# Patient Record
Sex: Male | Born: 1990 | Race: White | Hispanic: No | Marital: Single | Smoking: Current every day smoker
Health system: Southern US, Community
[De-identification: ages and names within clinical notes are randomized; demographics above are authoritative.]

## PROBLEM LIST (undated history)

## (undated) DIAGNOSIS — F32A Depression, unspecified: Secondary | ICD-10-CM

## (undated) DIAGNOSIS — F419 Anxiety disorder, unspecified: Secondary | ICD-10-CM

## (undated) HISTORY — PX: MOUTH SURGERY: SHX715

## (undated) HISTORY — PX: HAND SURGERY: SHX662

---
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
## 2019-08-14 ENCOUNTER — Other Ambulatory Visit: Payer: Self-pay

## 2019-08-14 ENCOUNTER — Encounter (HOSPITAL_COMMUNITY): Payer: Self-pay | Admitting: Emergency Medicine

## 2019-08-14 ENCOUNTER — Emergency Department (HOSPITAL_COMMUNITY)
Admission: EM | Admit: 2019-08-14 | Discharge: 2019-08-14 | Disposition: A | Discharge status: Home / Self Care | Payer: Self-pay | Attending: Emergency Medicine | Admitting: Emergency Medicine

## 2019-08-14 DIAGNOSIS — R21 Rash and other nonspecific skin eruption: Secondary | ICD-10-CM | POA: Insufficient documentation

## 2019-08-14 MED ORDER — CETIRIZINE HCL 10 MG PO TABS: 10.0000 mg | ORAL_TABLET | Freq: Every day | ORAL | 1 refills | Status: DC

## 2019-08-14 NOTE — Discharge Instructions (Signed)
Please take the cetirizine once daily to help with itching. Avoid scratching as much as possible to prevent a secondary bacterial infection. Follow-up with primary care if symptoms persist. Return to the emergency department for swelling of the lips or tongue, difficulty breathing, new or concerning symptoms.

## 2019-08-14 NOTE — ED Triage Notes (Signed)
Pt reports breaking out on and off for the past few days mostly on his torso. Unsure if he is getting bitten by something or encountering something that is breaking him out. Denies sob.

## 2019-08-14 NOTE — ED Notes (Signed)
Patient verbalizes understanding of discharge instructions . Opportunity for questions and answers were provided . Armband removed by staff ,Pt discharged from ED. W/C  offered at D/C  and Declined W/C at D/C and was escorted to lobby by RN.  

## 2019-08-14 NOTE — ED Provider Notes (Signed)
[facility] EMERGENCY DEPARTMENT Provider Note   CSN: [id number] Arrival date & time: 08/14/19  1001     History Chief Complaint  Patient presents with  . Rash    Joseph Franco is a 29 y.o. male without significant PMHx, presenting to the ED with complaint of intermittent rash to his torso over the last few days.  He states recently he got out of jail and is staying at a halfway house.  He states he is not washing his linens though he is washing his clothes and has not used any new detergents.  No new foods or medications.  No known insect bites.  His rash comes and goes it is mostly to his torso.  It is made worse when he sweats.  It is itchy.  No involvement of his mouth.  No swelling of his lips or tongue, no difficulty breathing or swallowing.  No shortness of breath or other systemic symptoms.  He has not treated his symptoms with any medications.  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  The history is provided by the patient.       History reviewed. No pertinent past medical history.  There are no problems to display for this patient.   History reviewed. No pertinent surgical history.     No family history on file.  Social History   Tobacco Use  . Smoking status: Not on file  Substance Use Topics  . Alcohol use: Not on file  . Drug use: Not on file    Home Medications Prior to Admission medications   Medication Sig Start Date End Date Taking? Authorizing Provider  cetirizine (ZYRTEC) 10 MG tablet Take 1 tablet (10 mg total) by mouth daily. 08/14/19   Augusta Hilbert, [place] N, PA-C    Allergies    Patient has no known allergies.  Review of Systems   Review of Systems  Constitutional: Negative for fever.  Skin: Positive for rash.    Physical Exam Updated Vital Signs BP 130/76   Pulse (!) 108   Temp 98.3 F (36.8 C)   Resp 12   SpO2 98%   Physical Exam Vitals and nursing note reviewed.  Constitutional:      Appearance: He is  well-developed.  HENT:     Head: Normocephalic and atraumatic.  Eyes:     Conjunctiva/sclera: Conjunctivae normal.  Cardiovascular:     Rate and Rhythm: Normal rate.  Pulmonary:     Effort: Pulmonary effort is normal. No respiratory distress.  Skin:    Comments: Excoriation throughout patient's torso though no obvious underlying rash at this time.  No drainage, erythema, induration.  No involvement of the palms.  Neurological:     Mental Status: He is alert.  Psychiatric:        Mood and Affect: Mood normal.        Behavior: Behavior normal.     ED Results / Procedures / Treatments   Labs (all labs ordered are listed, but only abnormal results are displayed) Labs Reviewed - No data to display  EKG None  Radiology No results found.  Procedures Procedures (including critical care time)  Medications Ordered in ED Medications - No data to display  ED Course  I have reviewed the triage vital signs and the nursing notes.  Pertinent labs & imaging results that were available during my care of the patient were reviewed by me and considered in my medical decision making (see chart for details).  MDM Rules/Calculators/A&P                         Patient presenting with intermittent itchy rash to his torso, seems consistent with urticaria.  He is not currently able to launder his own linens as he is staying in a halfway house.  No other new personal products foods, insect bites, medications.  No rashes present on evaluation today though there is evidence of excoriation on his chest.  He has not been treating his symptoms with anything.  No symptoms of anaphylaxis.  Recommend antihistamines and outpatient follow-up if symptoms persist.  Return precautions discussed.  Discussed results, findings, treatment and follow up. Patient advised of return precautions. Patient verbalized understanding and agreed with plan.  Final Clinical Impression(s) / ED Diagnoses Final diagnoses:    Rash    Rx / DC Orders ED Discharge Orders         Ordered    cetirizine (ZYRTEC) 10 MG tablet  Daily     Discontinue  Reprint     08/14/19 1256           Haille Pardi, [place] N, PA-C 08/14/19 1257    Benjiman Core, MD 08/14/19 1625

## 2019-09-07 ENCOUNTER — Emergency Department (HOSPITAL_COMMUNITY)
Admission: EM | Admit: 2019-09-07 | Discharge: 2019-09-07 | Disposition: A | Discharge status: Home / Self Care | Payer: Self-pay | Attending: Emergency Medicine | Admitting: Emergency Medicine

## 2019-09-07 ENCOUNTER — Encounter (HOSPITAL_COMMUNITY): Payer: Self-pay

## 2019-09-07 DIAGNOSIS — F410 Panic disorder [episodic paroxysmal anxiety] without agoraphobia: Secondary | ICD-10-CM | POA: Insufficient documentation

## 2019-09-07 DIAGNOSIS — Z5321 Procedure and treatment not carried out due to patient leaving prior to being seen by health care provider: Secondary | ICD-10-CM | POA: Insufficient documentation

## 2019-09-07 LAB — RAPID URINE DRUG SCREEN, HOSP PERFORMED
Amphetamines: NOT DETECTED
Barbiturates: NOT DETECTED
Benzodiazepines: NOT DETECTED
Cocaine: NOT DETECTED
Opiates: NOT DETECTED
Tetrahydrocannabinol: NOT DETECTED

## 2019-09-07 NOTE — ED Notes (Signed)
Pt called multiple times by staff and pt visitor. Pt visitor stated that he came in by ambulance and is not seen in the department lobby. Visitor stated that he uses EMS as a ride into town occasionally, so he must have left before visitor got here.

## 2019-09-07 NOTE — ED Triage Notes (Signed)
Pt arrives to ED via [facility] w/ c/o anxiety. Pt denies chest pain and sob. Pt states, "I felt like I was having a panic attack." EMS VSS.

## 2019-12-27 ENCOUNTER — Emergency Department (HOSPITAL_COMMUNITY)
Admission: EM | Admit: 2019-12-27 | Discharge: 2019-12-27 | Disposition: A | Discharge status: Home / Self Care | Payer: Self-pay | Attending: Emergency Medicine | Admitting: Emergency Medicine

## 2019-12-27 ENCOUNTER — Other Ambulatory Visit: Payer: Self-pay

## 2019-12-27 ENCOUNTER — Encounter (HOSPITAL_COMMUNITY): Payer: Self-pay | Admitting: *Deleted

## 2019-12-27 DIAGNOSIS — K122 Cellulitis and abscess of mouth: Secondary | ICD-10-CM | POA: Insufficient documentation

## 2019-12-27 DIAGNOSIS — F17210 Nicotine dependence, cigarettes, uncomplicated: Secondary | ICD-10-CM | POA: Insufficient documentation

## 2019-12-27 LAB — GROUP A STREP BY PCR: Group A Strep by PCR: NOT DETECTED

## 2019-12-27 MED ORDER — DIPHENHYDRAMINE HCL 25 MG PO CAPS
25.0000 mg | ORAL_CAPSULE | Freq: Once | ORAL | Status: AC
  Administered 2019-12-27: 25 mg via ORAL
  Filled 2019-12-27: qty 1

## 2019-12-27 MED ORDER — DIPHENHYDRAMINE HCL 25 MG PO TABS: 25.0000 mg | ORAL_TABLET | Freq: Three times a day (TID) | ORAL | 0 refills | Status: DC

## 2019-12-27 MED ORDER — DEXAMETHASONE SODIUM PHOSPHATE 10 MG/ML IJ SOLN
10.0000 mg | Freq: Once | INTRAMUSCULAR | Status: AC
  Administered 2019-12-27: 10 mg via INTRAMUSCULAR
  Filled 2019-12-27: qty 1

## 2019-12-27 MED ORDER — CEPHALEXIN 500 MG PO CAPS: 500.0000 mg | ORAL_CAPSULE | Freq: Two times a day (BID) | ORAL | 0 refills | Status: AC

## 2019-12-27 MED ORDER — FAMOTIDINE 20 MG PO TABS
20.0000 mg | ORAL_TABLET | Freq: Once | ORAL | Status: AC
  Administered 2019-12-27: 20 mg via ORAL
  Filled 2019-12-27: qty 1

## 2019-12-27 MED ORDER — CETIRIZINE HCL 10 MG PO TABS: 10.0000 mg | ORAL_TABLET | Freq: Every day | ORAL | 0 refills | Status: DC

## 2019-12-27 NOTE — ED Triage Notes (Signed)
Pt c/o swollen uvula x 2 hours

## 2019-12-27 NOTE — Discharge Instructions (Signed)
As discussed, today's evaluation was generally reassuring. Your uvulitis is likely due to either allergic or infectious causes.  Please take all medication as prescribed.  You may use over-the-counter substitutions for your prescribed Zyrtec and Benadryl, but please follow instructions for dosing.  Return here for concerning changes in your condition.

## 2019-12-27 NOTE — ED Notes (Signed)
Report given to oncoming nurse.

## 2019-12-27 NOTE — ED Provider Notes (Signed)
[facility] EMERGENCY DEPARTMENT Provider Note   CSN: [id number] Arrival date & time: 12/27/19  2952     History Chief Complaint  Patient presents with  . Sore Throat    Joseph Franco is a 29 y.o. male.  HPI Patient presents with a male companion who assists with the HPI. Patient was well prior to the onset of illness which was about 2 hours ago.  He denies medical problems, does have seasonal allergies, but no known food allergies.  He recalls eating any seafood restaurant yesterday.  Otherwise no recent medication change, diet change, activity change.  Today the patient awoke with full sensation in the posterior of his throat.  After looking the mirror he noticed his uvula was enlarged.  No chest pain, no dyspnea, no difficulty speaking, no fever, no vomiting.  No medication taken for relief.  History reviewed. No pertinent past medical history.  There are no problems to display for this patient.   Past Surgical History:  Procedure Laterality Date  . HAND SURGERY Right   . MOUTH SURGERY         History reviewed. No pertinent family history.  Social History   Tobacco Use  . Smoking status: Current Every Day Smoker    Packs/day: 1.00    Types: Cigarettes  . Smokeless tobacco: Never Used  Substance Use Topics  . Alcohol use: Yes    Comment: 6-8 beers daily  . Drug use: Not Currently    Home Medications Prior to Admission medications   Medication Sig Start Date End Date Taking? Authorizing Provider  cephALEXin (KEFLEX) 500 MG capsule Take 1 capsule (500 mg total) by mouth 2 (two) times daily for 5 days. 12/27/19 01/01/20  Gerhard Munch, MD  cetirizine (ZYRTEC) 10 MG tablet Take 1 tablet (10 mg total) by mouth daily for 5 days. 12/27/19 01/01/20  Gerhard Munch, MD  diphenhydrAMINE (BENADRYL) 25 MG tablet Take 1 tablet (25 mg total) by mouth 3 (three) times daily. Take one tablet three times daily for two days 12/27/19   Gerhard Munch, MD     Allergies    Patient has no known allergies.  Review of Systems   Review of Systems  Constitutional:       Per HPI, otherwise negative  HENT:       Per HPI, otherwise negative  Respiratory:       Per HPI, otherwise negative  Cardiovascular:       Per HPI, otherwise negative  Gastrointestinal: Negative for vomiting.  Endocrine:       Negative aside from HPI  Genitourinary:       Neg aside from HPI   Musculoskeletal:       Per HPI, otherwise negative  Skin: Negative.   Neurological: Negative for syncope.    Physical Exam Updated Vital Signs BP 131/73   Pulse 78   Temp 98.7 F (37.1 C) (Oral)   Resp 16   Ht 5\' 11"  (1.803 m)   Wt 81.6 kg   SpO2 96%   BMI 25.10 kg/m   Physical Exam Vitals and nursing note reviewed.  Constitutional:      General: He is not in acute distress.    Appearance: He is well-developed.  HENT:     Head: Normocephalic and atraumatic.     Mouth/Throat:   Eyes:     Conjunctiva/sclera: Conjunctivae normal.  Cardiovascular:     Rate and Rhythm: Normal rate and regular rhythm.  Pulmonary:     Effort: Pulmonary  effort is normal. No respiratory distress.     Breath sounds: No stridor.  Abdominal:     General: There is no distension.  Skin:    General: Skin is warm and dry.  Neurological:     Mental Status: He is alert and oriented to person, place, and time.     ED Results / Procedures / Treatments   Labs (all labs ordered are listed, but only abnormal results are displayed) Labs Reviewed  GROUP A STREP BY PCR    Procedures Procedures (including critical care time)  Medications Ordered in ED Medications  dexamethasone (DECADRON) injection 10 mg (10 mg Intramuscular Given 12/27/19 0748)  famotidine (PEPCID) tablet 20 mg (20 mg Oral Given 12/27/19 0748)  diphenhydrAMINE (BENADRYL) capsule 25 mg (25 mg Oral Given 12/27/19 4098)    ED Course  I have reviewed the triage vital signs and the nursing notes.  Pertinent labs &  imaging results that were available during my care of the patient were reviewed by me and considered in my medical decision making (see chart for details).    MDM Rules/Calculators/A&P                          9:01 AM On repeat exam the patient's uvula has diminished in size.  Strep test negative.  Discussed evaluation with him and his male companion, given his improvement, absence of airway compromise, evidence for bacteremia, sepsis, patient is appropriate for discharge with ongoing antibiotics, antihistamines. Final Clinical Impression(s) / ED Diagnoses Final diagnoses:  Uvulitis    Rx / DC Orders ED Discharge Orders         Ordered    cephALEXin (KEFLEX) 500 MG capsule  2 times daily        12/27/19 0900    cetirizine (ZYRTEC) 10 MG tablet  Daily        12/27/19 0900    diphenhydrAMINE (BENADRYL) 25 MG tablet  3 times daily        12/27/19 0900           Gerhard Munch, MD 12/27/19 [phone]

## 2023-05-06 ENCOUNTER — Other Ambulatory Visit: Payer: Self-pay

## 2023-05-06 ENCOUNTER — Emergency Department (HOSPITAL_COMMUNITY)
Admission: EM | Admit: 2023-05-06 | Discharge: 2023-05-07 | Disposition: A | Discharge status: Home / Self Care | Attending: Emergency Medicine | Admitting: Emergency Medicine

## 2023-05-06 ENCOUNTER — Encounter (HOSPITAL_COMMUNITY): Payer: Self-pay

## 2023-05-06 ENCOUNTER — Emergency Department (HOSPITAL_COMMUNITY)

## 2023-05-06 DIAGNOSIS — S6710XA Crushing injury of unspecified finger(s), initial encounter: Secondary | ICD-10-CM | POA: Diagnosis not present

## 2023-05-06 DIAGNOSIS — S62664B Nondisplaced fracture of distal phalanx of right ring finger, initial encounter for open fracture: Secondary | ICD-10-CM | POA: Diagnosis not present

## 2023-05-06 DIAGNOSIS — S6991XA Unspecified injury of right wrist, hand and finger(s), initial encounter: Secondary | ICD-10-CM | POA: Diagnosis present

## 2023-05-06 DIAGNOSIS — S62639B Displaced fracture of distal phalanx of unspecified finger, initial encounter for open fracture: Secondary | ICD-10-CM

## 2023-05-06 DIAGNOSIS — W230XXA Caught, crushed, jammed, or pinched between moving objects, initial encounter: Secondary | ICD-10-CM | POA: Diagnosis not present

## 2023-05-06 DIAGNOSIS — F17210 Nicotine dependence, cigarettes, uncomplicated: Secondary | ICD-10-CM | POA: Diagnosis not present

## 2023-05-06 MED ORDER — HYDROMORPHONE HCL 1 MG/ML IJ SOLN
1.0000 mg | Freq: Once | INTRAMUSCULAR | Status: AC
  Administered 2023-05-06: 1 mg via INTRAVENOUS

## 2023-05-06 MED ORDER — HYDROMORPHONE HCL 1 MG/ML IJ SOLN
1.0000 mg | Freq: Once | INTRAMUSCULAR | Status: DC
  Filled 2023-05-06: qty 1

## 2023-05-06 MED ORDER — LIDOCAINE HCL (PF) 1 % IJ SOLN
30.0000 mL | Freq: Once | INTRAMUSCULAR | Status: AC
  Administered 2023-05-06: 30 mL
  Filled 2023-05-06: qty 30

## 2023-05-06 MED ORDER — CEFAZOLIN SODIUM-DEXTROSE 2-4 GM/100ML-% IV SOLN
2.0000 g | Freq: Once | INTRAVENOUS | Status: AC
  Administered 2023-05-07: 2 g via INTRAVENOUS
  Filled 2023-05-06: qty 100
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
## 2023-05-06 NOTE — ED Notes (Signed)
 Warm soapy soak and wound cleaning completed per provider. Patient unable to tolerate soak for long due to pain.
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
## 2023-05-07 MED ORDER — HYDROMORPHONE HCL 1 MG/ML IJ SOLN
2.0000 mg | Freq: Once | INTRAMUSCULAR | Status: AC
  Administered 2023-05-07: 2 mg via INTRAVENOUS
  Filled 2023-05-07: qty 2

## 2023-05-07 MED ORDER — CEPHALEXIN 500 MG PO CAPS: 500.0000 mg | ORAL_CAPSULE | Freq: Three times a day (TID) | ORAL | 0 refills | Status: AC

## 2023-05-07 MED ORDER — OXYCODONE-ACETAMINOPHEN 5-325 MG PO TABS: 1.0000 | ORAL_TABLET | Freq: Four times a day (QID) | ORAL | 0 refills | Status: AC | PRN

## 2023-05-07 NOTE — Discharge Instructions (Addendum)
 You were evaluated in the Emergency Department and after careful evaluation, we did not find any emergent condition requiring admission or further testing in the hospital.  Your exam/testing today was overall reassuring.  You have a partial amputation of your finger.  This will require close follow-up with the hand specialist for definitive repair.  Call the office of Dr. Romeo Apple in the morning to set up an official appointment time.  He is aware of your case.  If for some reason you are unable to get follow-up, would recommend return to the emergency department within the next 2 or 3 days for reassessment.  Use the Keflex antibiotic to prevent infection, take the Percocet medication as needed for pain.  Please return to the Emergency Department if you experience any worsening of your condition.  Thank you for allowing [place] to be a part of your care.

## 2023-05-10 MED FILL — Oxycodone w/ Acetaminophen Tab 5-325 MG: ORAL | Qty: 6 | Status: AC

## 2023-07-21 ENCOUNTER — Ambulatory Visit: Admitting: Orthopaedic Surgery

## 2023-07-21 ENCOUNTER — Encounter: Payer: Self-pay | Admitting: Orthopaedic Surgery

## 2023-07-21 VITALS — BP 140/88 | HR 80 | Ht 71.0 in | Wt 180.0 lb

## 2023-07-21 DIAGNOSIS — S6991XA Unspecified injury of right wrist, hand and finger(s), initial encounter: Secondary | ICD-10-CM
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
## 2023-07-21 NOTE — Patient Instructions (Signed)
 To see hand surgery [place]

## 2023-08-09 ENCOUNTER — Other Ambulatory Visit: Payer: Self-pay

## 2023-08-09 ENCOUNTER — Ambulatory Visit (INDEPENDENT_AMBULATORY_CARE_PROVIDER_SITE_OTHER): Admitting: Orthopedic Surgery

## 2023-08-09 DIAGNOSIS — S6991XA Unspecified injury of right wrist, hand and finger(s), initial encounter: Secondary | ICD-10-CM

## 2023-08-09 DIAGNOSIS — S6710XA Crushing injury of unspecified finger(s), initial encounter: Secondary | ICD-10-CM

## 2023-08-09 HISTORY — DX: Crushing injury of unspecified finger(s), initial encounter: S67.10XA
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
## 2023-08-12 ENCOUNTER — Telehealth: Payer: Self-pay

## 2023-08-12 NOTE — Telephone Encounter (Signed)
 FYI I spoke with [place], nurse for the prison and she stated since pt will need OT after surgery and multiple visits they will potentially have to [facility] the [facility]. She will call us  back and let us  know soon

## 2023-08-24 NOTE — Telephone Encounter (Signed)
 Last conversation in regards to not having a surgery sheet was created in error. Incorrect patient, please disregard.

## 2023-08-24 NOTE — Telephone Encounter (Signed)
 I spoke with the nurse and scheduled the patient for surgery on 7/25.

## 2023-09-06 ENCOUNTER — Encounter (HOSPITAL_BASED_OUTPATIENT_CLINIC_OR_DEPARTMENT_OTHER): Payer: Self-pay | Admitting: Orthopedic Surgery

## 2023-09-06 ENCOUNTER — Other Ambulatory Visit: Payer: Self-pay

## 2023-09-07 ENCOUNTER — Other Ambulatory Visit: Payer: Self-pay

## 2023-09-07 DIAGNOSIS — S6991XA Unspecified injury of right wrist, hand and finger(s), initial encounter: Secondary | ICD-10-CM

## 2023-09-09 NOTE — Anesthesia Preprocedure Evaluation (Signed)
 Anesthesia Evaluation  Patient identified by MRN, date of birth, ID band Patient awake    Reviewed: Allergy & Precautions, NPO status , Patient's Chart, lab work & pertinent test results  History of Anesthesia Complications Negative for: history of anesthetic complications  Airway Mallampati: I  TM Distance: >3 FB Neck ROM: Full    Dental  (+) Missing, Poor Dentition, Dental Advisory Given   Pulmonary Current Smoker and Patient abstained from smoking.   breath sounds clear to auscultation       Cardiovascular negative cardio ROS  Rhythm:Regular Rate:Normal     Neuro/Psych   Anxiety Depression    negative neurological ROS     GI/Hepatic negative GI ROS, Neg liver ROS,,,  Endo/Other  BMI 32  Renal/GU negative Renal ROS     Musculoskeletal   Abdominal   Peds  Hematology negative hematology ROS (+)   Anesthesia Other Findings   Reproductive/Obstetrics                              Anesthesia Physical Anesthesia Plan  ASA: 2  Anesthesia Plan: General   Post-op Pain Management: Tylenol  PO (pre-op)*   Induction: Intravenous  PONV Risk Score and Plan: Dexamethasone  and Ondansetron  Airway Management Planned: LMA  Additional Equipment: None  Intra-op Plan:   Post-operative Plan:   Informed Consent: I have reviewed the patients History and Physical, chart, labs and discussed the procedure including the risks, benefits and alternatives for the proposed anesthesia with the patient or authorized representative who has indicated his/her understanding and acceptance.     Dental advisory given  Plan Discussed with: CRNA and Surgeon  Anesthesia Plan Comments:          Anesthesia Quick Evaluation

## 2023-09-10 ENCOUNTER — Other Ambulatory Visit: Payer: Self-pay

## 2023-09-10 ENCOUNTER — Ambulatory Visit (HOSPITAL_BASED_OUTPATIENT_CLINIC_OR_DEPARTMENT_OTHER): Payer: Self-pay | Admitting: Anesthesiology

## 2023-09-10 ENCOUNTER — Ambulatory Visit (HOSPITAL_BASED_OUTPATIENT_CLINIC_OR_DEPARTMENT_OTHER)
Admission: RE | Admit: 2023-09-10 | Discharge: 2023-09-10 | Disposition: A | Discharge status: Home / Self Care | Attending: Orthopedic Surgery | Admitting: Orthopedic Surgery

## 2023-09-10 ENCOUNTER — Ambulatory Visit (HOSPITAL_BASED_OUTPATIENT_CLINIC_OR_DEPARTMENT_OTHER)

## 2023-09-10 ENCOUNTER — Encounter (HOSPITAL_BASED_OUTPATIENT_CLINIC_OR_DEPARTMENT_OTHER)
Admission: RE | Disposition: A | Discharge status: Home / Self Care | Payer: Self-pay | Source: Home / Self Care | Attending: Orthopedic Surgery

## 2023-09-10 ENCOUNTER — Encounter (HOSPITAL_BASED_OUTPATIENT_CLINIC_OR_DEPARTMENT_OTHER): Payer: Self-pay | Admitting: Orthopedic Surgery

## 2023-09-10 DIAGNOSIS — M20021 Boutonniere deformity of right finger(s): Secondary | ICD-10-CM | POA: Diagnosis not present

## 2023-09-10 DIAGNOSIS — X58XXXA Exposure to other specified factors, initial encounter: Secondary | ICD-10-CM | POA: Diagnosis not present

## 2023-09-10 DIAGNOSIS — M20091 Other deformity of right finger(s): Secondary | ICD-10-CM

## 2023-09-10 DIAGNOSIS — S6991XA Unspecified injury of right wrist, hand and finger(s), initial encounter: Secondary | ICD-10-CM | POA: Diagnosis not present

## 2023-09-10 DIAGNOSIS — M25341 Other instability, right hand: Secondary | ICD-10-CM | POA: Diagnosis present

## 2023-09-10 HISTORY — DX: Anxiety disorder, unspecified: F41.9

## 2023-09-10 HISTORY — PX: CAPSULOTOMY: SHX379

## 2023-09-10 HISTORY — DX: Depression, unspecified: F32.A

## 2023-09-10 SURGERY — CAPSULOTOMY, WRIST
Anesthesia: General | Site: Index Finger | Laterality: Right

## 2023-09-10 MED ORDER — FENTANYL CITRATE (PF) 100 MCG/2ML IJ SOLN
INTRAMUSCULAR | Status: AC
  Filled 2023-09-10: qty 2

## 2023-09-10 MED ORDER — DEXAMETHASONE SODIUM PHOSPHATE 10 MG/ML IJ SOLN
INTRAMUSCULAR | Status: DC | PRN
  Administered 2023-09-10: 5 mg via INTRAVENOUS

## 2023-09-10 MED ORDER — OXYCODONE HCL 5 MG PO TABS
ORAL_TABLET | ORAL | Status: AC
  Filled 2023-09-10: qty 1

## 2023-09-10 MED ORDER — MEPERIDINE HCL 25 MG/ML IJ SOLN: 6.2500 mg | INTRAMUSCULAR | Status: DC | PRN

## 2023-09-10 MED ORDER — EPHEDRINE 5 MG/ML INJ
INTRAVENOUS | Status: AC
  Filled 2023-09-10: qty 5

## 2023-09-10 MED ORDER — CEFAZOLIN SODIUM-DEXTROSE 2-4 GM/100ML-% IV SOLN: 2.0000 g | INTRAVENOUS | Status: DC

## 2023-09-10 MED ORDER — EPHEDRINE SULFATE (PRESSORS) 50 MG/ML IJ SOLN
INTRAMUSCULAR | Status: DC | PRN
  Administered 2023-09-10: 10 mg via INTRAVENOUS
  Administered 2023-09-10: 5 mg via INTRAVENOUS
  Administered 2023-09-10: 10 mg via INTRAVENOUS

## 2023-09-10 MED ORDER — EPHEDRINE 5 MG/ML INJ
INTRAVENOUS | Status: AC
  Filled 2023-09-10: qty 15

## 2023-09-10 MED ORDER — LIDOCAINE 2% (20 MG/ML) 5 ML SYRINGE
INTRAMUSCULAR | Status: AC
  Filled 2023-09-10: qty 5

## 2023-09-10 MED ORDER — DEXAMETHASONE SODIUM PHOSPHATE 10 MG/ML IJ SOLN
INTRAMUSCULAR | Status: AC
Start: 2023-09-10 — End: 2023-09-10
  Filled 2023-09-10: qty 1

## 2023-09-10 MED ORDER — PHENYLEPHRINE HCL (PRESSORS) 10 MG/ML IV SOLN
INTRAVENOUS | Status: DC | PRN
Start: 2023-09-10 — End: 2023-09-10
  Administered 2023-09-10: 160 ug via INTRAVENOUS
  Administered 2023-09-10: 80 ug via INTRAVENOUS

## 2023-09-10 MED ORDER — ACETAMINOPHEN 500 MG PO TABS
1000.0000 mg | ORAL_TABLET | Freq: Once | ORAL | Status: AC
  Administered 2023-09-10: 1000 mg via ORAL

## 2023-09-10 MED ORDER — OXYCODONE HCL 5 MG/5ML PO SOLN: 5.0000 mg | Freq: Once | ORAL | Status: AC | PRN

## 2023-09-10 MED ORDER — MIDAZOLAM HCL 2 MG/2ML IJ SOLN
INTRAMUSCULAR | Status: DC | PRN
  Administered 2023-09-10: 2 mg via INTRAVENOUS

## 2023-09-10 MED ORDER — MIDAZOLAM HCL 2 MG/2ML IJ SOLN: 0.5000 mg | Freq: Once | INTRAMUSCULAR | Status: DC | PRN

## 2023-09-10 MED ORDER — ACETAMINOPHEN 500 MG PO TABS
ORAL_TABLET | ORAL | Status: AC
Start: 2023-09-10 — End: 2023-09-10
  Filled 2023-09-10: qty 2

## 2023-09-10 MED ORDER — HYDROMORPHONE HCL 1 MG/ML IJ SOLN
INTRAMUSCULAR | Status: AC
  Filled 2023-09-10: qty 0.5

## 2023-09-10 MED ORDER — PROPOFOL 10 MG/ML IV BOLUS
INTRAVENOUS | Status: AC
  Filled 2023-09-10: qty 20

## 2023-09-10 MED ORDER — HYDROMORPHONE HCL 1 MG/ML IJ SOLN
0.2500 mg | INTRAMUSCULAR | Status: DC | PRN
  Administered 2023-09-10 (×2): 0.5 mg via INTRAVENOUS

## 2023-09-10 MED ORDER — CEFAZOLIN SODIUM-DEXTROSE 2-4 GM/100ML-% IV SOLN
INTRAVENOUS | Status: AC
  Filled 2023-09-10: qty 100

## 2023-09-10 MED ORDER — LIDOCAINE HCL (CARDIAC) PF 100 MG/5ML IV SOSY
PREFILLED_SYRINGE | INTRAVENOUS | Status: DC | PRN
  Administered 2023-09-10: 20 mg via INTRAVENOUS

## 2023-09-10 MED ORDER — ONDANSETRON HCL 4 MG/2ML IJ SOLN
INTRAMUSCULAR | Status: AC
  Filled 2023-09-10: qty 2

## 2023-09-10 MED ORDER — OXYCODONE HCL 5 MG PO TABS
5.0000 mg | ORAL_TABLET | Freq: Once | ORAL | Status: AC | PRN
  Administered 2023-09-10: 5 mg via ORAL

## 2023-09-10 MED ORDER — OXYCODONE HCL 5 MG PO TABS: 5.0000 mg | ORAL_TABLET | Freq: Four times a day (QID) | ORAL | 0 refills | Status: AC | PRN

## 2023-09-10 MED ORDER — ONDANSETRON HCL 4 MG/2ML IJ SOLN
INTRAMUSCULAR | Status: DC | PRN
  Administered 2023-09-10: 4 mg via INTRAVENOUS

## 2023-09-10 MED ORDER — LIDOCAINE HCL 1 % IJ SOLN
INTRAMUSCULAR | Status: DC | PRN
  Administered 2023-09-10: 10 mL via INTRADERMAL

## 2023-09-10 MED ORDER — LACTATED RINGERS IV SOLN: INTRAVENOUS | Status: DC | PRN

## 2023-09-10 MED ORDER — FENTANYL CITRATE (PF) 100 MCG/2ML IJ SOLN
INTRAMUSCULAR | Status: DC | PRN
  Administered 2023-09-10: 100 ug via INTRAVENOUS

## 2023-09-10 MED ORDER — PROPOFOL 10 MG/ML IV BOLUS
INTRAVENOUS | Status: DC | PRN
  Administered 2023-09-10: 300 mg via INTRAVENOUS

## 2023-09-10 MED ORDER — MIDAZOLAM HCL 2 MG/2ML IJ SOLN
INTRAMUSCULAR | Status: AC
  Filled 2023-09-10: qty 2

## 2023-09-10 MED ORDER — DEXMEDETOMIDINE HCL IN NACL 80 MCG/20ML IV SOLN
INTRAVENOUS | Status: DC | PRN
  Administered 2023-09-10: 12 ug via INTRAVENOUS
  Administered 2023-09-10: 8 ug via INTRAVENOUS

## 2023-09-10 MED ORDER — LACTATED RINGERS IV SOLN: INTRAVENOUS | Status: DC

## 2023-09-10 MED ORDER — CEFAZOLIN SODIUM-DEXTROSE 2-3 GM-%(50ML) IV SOLR
INTRAVENOUS | Status: DC | PRN
  Administered 2023-09-10: 2 g via INTRAVENOUS

## 2023-09-10 SURGICAL SUPPLY — 45 items
BLADE SURG 15 STRL LF DISP TIS (BLADE) ×2 IMPLANT
BNDG COHESIVE 4X5 TAN STRL LF (GAUZE/BANDAGES/DRESSINGS) ×1 IMPLANT
BNDG COMPR ESMARK 4X3 LF (GAUZE/BANDAGES/DRESSINGS) ×1 IMPLANT
BNDG ELASTIC 3INX 5YD STR LF (GAUZE/BANDAGES/DRESSINGS) IMPLANT
BNDG ELASTIC 4INX 5YD STR LF (GAUZE/BANDAGES/DRESSINGS) ×2 IMPLANT
BNDG GAUZE DERMACEA FLUFF 4 (GAUZE/BANDAGES/DRESSINGS) ×1 IMPLANT
CAP PIN ORTHO PINK (CAP) IMPLANT
CHLORAPREP W/TINT 26 (MISCELLANEOUS) ×1 IMPLANT
CORD BIPOLAR FORCEPS 12FT (ELECTRODE) ×1 IMPLANT
COVER BACK TABLE 60X90IN (DRAPES) ×1 IMPLANT
CUFF TOURN SGL QUICK 18X4 (TOURNIQUET CUFF) IMPLANT
CUFF TRNQT CYL 24X4X16.5-23 (TOURNIQUET CUFF) ×1 IMPLANT
DRAPE HAND 77X146 (DRAPES) ×1 IMPLANT
DRAPE OEC MINIVIEW 54X84 (DRAPES) ×1 IMPLANT
DRAPE SURG 17X23 STRL (DRAPES) ×1 IMPLANT
GAUZE SPONGE 4X4 12PLY STRL (GAUZE/BANDAGES/DRESSINGS) ×1 IMPLANT
GAUZE XEROFORM 1X8 LF (GAUZE/BANDAGES/DRESSINGS) ×1 IMPLANT
GLOVE BIO SURGEON STRL SZ7.5 (GLOVE) ×2 IMPLANT
GLOVE BIOGEL PI IND STRL 7.5 (GLOVE) ×1 IMPLANT
GOWN STRL REUS W/ TWL LRG LVL3 (GOWN DISPOSABLE) ×1 IMPLANT
GOWN STRL REUS W/TWL XL LVL3 (GOWN DISPOSABLE) ×1 IMPLANT
GOWN STRL SURGICAL XL XLNG (GOWN DISPOSABLE) ×2 IMPLANT
KWIRE SGLE END .054 LG (WIRE) IMPLANT
MANIFOLD NEPTUNE II (INSTRUMENTS) ×1 IMPLANT
NDL HYPO 25X5/8 SAFETYGLIDE (NEEDLE) IMPLANT
NEEDLE HYPO 25X5/8 SAFETYGLIDE (NEEDLE) IMPLANT
NS IRRIG 1000ML POUR BTL (IV SOLUTION) IMPLANT
PACK BASIN DAY SURGERY FS (CUSTOM PROCEDURE TRAY) ×1 IMPLANT
PAD CAST 4YDX4 CTTN HI CHSV (CAST SUPPLIES) ×2 IMPLANT
SHEET MEDIUM DRAPE 40X70 STRL (DRAPES) ×1 IMPLANT
SLEEVE SCD COMPRESS KNEE MED (STOCKING) IMPLANT
SLING ARM FOAM STRAP LRG (SOFTGOODS) IMPLANT
SPIKE FLUID TRANSFER (MISCELLANEOUS) IMPLANT
SPLINT PLASTER CAST XFAST 4X15 (CAST SUPPLIES) ×10 IMPLANT
STOCKINETTE IMPERVIOUS 9X36 MD (GAUZE/BANDAGES/DRESSINGS) ×1 IMPLANT
SUCTION TUBE FRAZIER 10FR DISP (SUCTIONS) IMPLANT
SUT ETHILON 4 0 PS 2 18 (SUTURE) ×1 IMPLANT
SUT MNCRL AB 3-0 PS2 27 (SUTURE) ×1 IMPLANT
SUT VIC AB 3-0 FS2 27 (SUTURE) IMPLANT
SUT VIC AB 4-0 PS2 18 (SUTURE) IMPLANT
SYR BULB EAR ULCER 3OZ GRN STR (SYRINGE) ×2 IMPLANT
SYR CONTROL 10ML LL (SYRINGE) IMPLANT
TOWEL GREEN STERILE FF (TOWEL DISPOSABLE) ×2 IMPLANT
TUBE CONNECTING 20X1/4 (TUBING) IMPLANT
UNDERPAD 30X36 HEAVY ABSORB (UNDERPADS AND DIAPERS) ×1 IMPLANT

## 2023-09-10 NOTE — Anesthesia Procedure Notes (Signed)
 Procedure Name: LMA Insertion Date/Time: 09/10/2023 7:41 AM  Performed by: Burnard Rosaline HERO, CRNAPre-anesthesia Checklist: Patient identified, Emergency Drugs available, Suction available and Patient being monitored Patient Re-evaluated:Patient Re-evaluated prior to induction Oxygen Delivery Method: Circle system utilized Preoxygenation: Pre-oxygenation with 100% oxygen Induction Type: IV induction Ventilation: Mask ventilation without difficulty LMA: LMA inserted LMA Size: 4.0 Number of attempts: 1 Airway Equipment and Method: Bite block Placement Confirmation: positive ETCO2, breath sounds checked- equal and bilateral and CO2 detector Tube secured with: Tape Dental Injury: Teeth and Oropharynx as per pre-operative assessment

## 2023-09-10 NOTE — Anesthesia Postprocedure Evaluation (Signed)
 Anesthesia Post Note  Patient: Joseph Franco  Procedure(s) Performed: CAPSULOTOMY, WRIST (Right: Index Finger)     Patient location during evaluation: PACU Anesthesia Type: General Level of consciousness: awake and alert, oriented and patient cooperative Pain management: pain level controlled Vital Signs Assessment: post-procedure vital signs reviewed and stable Respiratory status: spontaneous breathing, nonlabored ventilation and respiratory function stable Cardiovascular status: blood pressure returned to baseline and stable Postop Assessment: no apparent nausea or vomiting Anesthetic complications: no   No notable events documented.  Last Vitals:  Vitals:   09/10/23 0915 09/10/23 0923  BP: (!) 132/92   Pulse: 90 92  Resp: 17 (!) 24  Temp:    SpO2: 97% 96%    Last Pain:  Vitals:   09/10/23 0923  TempSrc:   PainSc: 8                  Lamin Chandley,E. Domonick Sittner
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
## 2023-09-10 NOTE — Discharge Instructions (Addendum)
    Hand Surgery Postop Instructions   Dressings: Maintain postoperative dressing until orthopedic follow-up.  Keep operative site clean and dry until orthopedic follow-up.  Wound Care: Keep your hand elevated above the level of your heart.  Do not allow it to dangle by your side. Moving your fingers is advised to stimulate circulation but will depend on the site of your surgery.  If you have a splint applied, your doctor will advise you regarding movement.  Activity: Do not drive or operate machinery until clearance given from physician. No heavy lifting with operative extremity.  Diet:  Drink liquids today or eat a light diet.  You may resume a regular diet tomorrow.    General expectations: Take prescribed medication if given, transition to over-the-counter medication as quickly as possible. Fingers may become slightly swollen.  Call your doctor if any of the following occur: Severe pain not relieved by pain medication. Elevated temperature. Dressing soaked with blood. Inability to move fingers. White or bluish color to fingers.   Per [facility] clinic policy, our goal is ensure optimal postoperative pain control with a multimodal pain management strategy. For all [facility] patients, our goal is to wean post-operative narcotic medications by 6 weeks post-operatively. If this is not possible due to utilization of pain medication prior to surgery, your [facility] doctor will support your acute post-operative pain control for the first 6 weeks postoperatively, with a plan to transition you back to your primary pain team following that. Joseph Franco will work to ensure a Therapist, occupational.  Joseph Franco, M.D. Hand Surgery [facility]    Post Anesthesia Home Care Instructions  Activity: Get plenty of rest for the remainder of the day. A responsible individual must stay with you for 24 hours following the procedure.  For the next 24 hours, DO NOT: -Drive a  car -Advertising copywriter -Drink alcoholic beverages -Take any medication unless instructed by your physician -Make any legal decisions or sign important papers.  Meals: Start with liquid foods such as gelatin or soup. Progress to regular foods as tolerated. Avoid greasy, spicy, heavy foods. If nausea and/or vomiting occur, drink only clear liquids until the nausea and/or vomiting subsides. Call your physician if vomiting continues.  Special Instructions/Symptoms: Your throat may feel dry or sore from the anesthesia or the breathing tube placed in your throat during surgery. If this causes discomfort, gargle with warm salt water. The discomfort should disappear within 24 hours.  If you had a scopolamine patch placed behind your ear for the management of post- operative nausea and/or vomiting:  1. The medication in the patch is effective for 72 hours, after which it should be removed.  Wrap patch in a tissue and discard in the trash. Wash hands thoroughly with soap and water. 2. You may remove the patch earlier than 72 hours if you experience unpleasant side effects which may include dry mouth, dizziness or visual disturbances. 3. Avoid touching the patch. Wash your hands with soap and water after contact with the patch.    No tylenol  until after 12:20 today.

## 2023-09-10 NOTE — Op Note (Addendum)
 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   ASSISTANT: Almeda Rummer, PA   ANESTHESIA:  General   INTRAVENOUS FLUIDS:  Per anesthesia flow sheet.   ESTIMATED BLOOD LOSS:  Minimal.   COMPLICATIONS:  None.   SPECIMENS:  none   TOURNIQUET TIME:    Total Tourniquet Time Documented: Upper Arm (Right) - 16 minutes Total: Upper Arm (Right) - 16 minutes    DISPOSITION:  Stable to PACU.   INDICATIONS: 33 year old male who was seen in the outpatient setting with ongoing right index finger PIP joint contracture and concern for central slip injury.  There was a remote history of prior injury and surgical intervention of the index finger.  Patient was indicated for right index finger PIP joint exploration, possible central slip repair, likely PIP joint stabilization and capsulodesis.  Risks and benefits of surgery were discussed including the risks of infection, bleeding, scarring, stiffness, nerve injury, vascular injury, tendon injury, need for subsequent operation, ongoing lack of motion, PIP instability, hardware failure, recurrence.  No guarantees were given about the long-term range of motion or stability of the digit given the chronicity of this problem.  He voiced understanding of these risks and elected to proceed.  OPERATIVE COURSE: Patient was seen and identified in the preoperative area and marked appropriately.  Surgical consent had been signed. Preoperative IV antibiotic prophylaxis was given. He was transferred to the operating room and  placed in supine position with the Right upper extremity on an arm board.  General anesthesia was induced by the anesthesiologist.  Right upper extremity was prepped and draped in normal sterile orthopedic fashion.  A surgical pause was performed between the surgeons, anesthesia, and operating room staff and all were in agreement as to the patient, procedure, and site of procedure.  Tourniquet was placed and padded appropriately to the right upper arm.  The arm was exsanguinated and the tourniquet was elevated to .  Longitudinal incision was designed over the dorsal aspect of the PIP region.  Extensor splitting approach was utilized.  Significant scarring was notable in this region, prior Ethibond sutures were seen at the extensor apparatus proximal to the PIP region. Prior Ethibond sutures of the proximal portion of the extensor apparatus of the index finger were also removed, scar was released in this region to improve tendon excursion.  Upon direct inspection of PIP joint, the collateral ligaments were noted to be intact as well as the lateral bands.  There was notable improvement in the PIP range of motion passively at this juncture.  The extensor mechanism was exposed.  The central slip was found to be not completely avulsed from its insertion at the dorsal base of the middle phalanx, however was severely attenuated.  Lateral bands were identified and retracted laterally.  Lateral bands were then mobilized reposition dorsally, sutured together over the middle phalanx using 3-0 Ethibond in figure-of-eight fashion.    Given the chronicity of this injury and the ongoing PIP instability.  Decision was made to perform PIP capsulodesis and joint stabilization with pinning.  0.054 K wire was then introduced as a  transfixation pin for the PIP joint in retrograde fashion under biplanar fluoroscopy.  Fluoroscopy was utilized to confirm appropriate pin position, K wire was inserted in bicortical fashion  across the PIP joint in an extended position.  The tourniquet was deflated at 16 minutes.  Fingertips were pink with brisk capillary refill after deflation of tourniquet.  Copious irrigation was performed.  Layered closure was performed utilizing 3-0 Vicryl and 4-0 nylon suture.  Sterile dressings were applied followed by application of a clamshell splint with index through small finger held in an intrinsic plus posture.  The operative drapes were broken down.  The patient was awoken from anesthesia safely and taken to PACU in stable condition.   Post-operative plan: The patient will recover in the post-anesthesia care unit and then be discharged back to incarceration facility.  The patient will be non weight bearing on the right upper extremity in a short arm splint.   I will see the patient back in the office in 2 weeks for postoperative followup.  Transfixation pin for the PIP joint will remain in for approximately 4 weeks.  Discharge instructions were provided at the facility to maintain the postoperative dressing until follow-up.  Appropriate medications were provided as well.  Joseph Cosio, MD Electronically signed, 09/10/23

## 2023-09-10 NOTE — Transfer of Care (Signed)
 Immediate Anesthesia Transfer of Care Note  Patient: Joseph Franco  Procedure(s) Performed: CAPSULOTOMY, WRIST (Right: Index Finger)  Patient Location: PACU  Anesthesia Type:General  Level of Consciousness: awake, alert , and oriented  Airway & Oxygen Therapy: Patient Spontanous Breathing and Patient connected to face mask oxygen  Post-op Assessment: Report given to RN and Post -op Vital signs reviewed and stable  Post vital signs: Reviewed and stable  Last Vitals:  Vitals Value Taken Time  BP 132/85 09/10/23 08:48  Temp    Pulse 103 09/10/23 08:50  Resp 12 09/10/23 08:50  SpO2 86 % 09/10/23 08:50  Vitals shown include unfiled device data.  Last Pain:  Vitals:   09/10/23 0615  TempSrc: Temporal  PainSc: 7       Patients Stated Pain Goal: 3 (09/10/23 0615)  Complications: No notable events documented.

## 2023-09-11 ENCOUNTER — Encounter (HOSPITAL_BASED_OUTPATIENT_CLINIC_OR_DEPARTMENT_OTHER): Payer: Self-pay | Admitting: Orthopedic Surgery

## 2023-09-22 NOTE — Therapy (Signed)
 OUTPATIENT OCCUPATIONAL THERAPY ORTHO EVALUATION  Patient Name: Joseph Franco MRN: [id number] DOB:1990/10/01, 33 y.o., male Today's Date: 09/23/2023  PCP: N/A REFERRING PROVIDER: Arlinda Buster, MD   END OF SESSION:  OT End of Session - 09/23/23 0831     Visit Number 1    Number of Visits 6    Date for OT Re-Evaluation 11/19/23    Authorization Type state correctional facility    OT Start Time 0831    OT Stop Time 0908    OT Time Calculation (min) 37 min    Equipment Utilized During Treatment Orthotic materials    Activity Tolerance Patient tolerated treatment well;No increased pain;Patient limited by pain;Patient limited by fatigue    Behavior During Therapy [facility] for tasks assessed/performed          Past Medical History:  Diagnosis Date   Anxiety    Crush injury to finger 08/09/2023   Depression    Past Surgical History:  Procedure Laterality Date   CAPSULOTOMY Right 09/10/2023   Procedure: CAPSULOTOMY, WRIST;  Surgeon: Arlinda Buster, MD;  Location: [facility];  Service: Orthopedics;  Laterality: Right;  RIGHT INDEX FINGER PIP JOINT STABILIZATION WITH CAPSULEDESIS AND PINNING   HAND SURGERY Right    MOUTH SURGERY     Patient Active Problem List   Diagnosis Date Noted   Central slip extensor tendon injury (boutonniere), right 09/10/2023    ONSET DATE: DOS 09/10/23  REFERRING DIAG: D30.08KJ (ICD-10-CM) - Injury of right hand, initial encounter   THERAPY DIAG:  Pain in right hand - Plan: Ot plan of care cert/re-cert  Localized edema - Plan: Ot plan of care cert/re-cert  Muscle weakness (generalized) - Plan: Ot plan of care cert/re-cert  Stiffness of right hand, not elsewhere classified - Plan: Ot plan of care cert/re-cert  Rationale for Evaluation and Treatment: Rehabilitation  SUBJECTIVE:   SUBJECTIVE STATEMENT: Now  2 weeks post-op.  He states having high pain in his finger, at the moment.  He is encouraged that over the next 2 weeks,  especially in a brace, that his pain should come down as he heals.   Pt accompanied by: guard- the pt is currently incarcerated   PERTINENT HISTORY: about 4 months s/p crush injury with partial amputations of IF, RF of Rt hand. He developed flexion contracture of PIPJ and underwent lateral band repair and PIP J capsulodesis with pinning    PRECAUTIONS: None  RED FLAGS: None   WEIGHT BEARING RESTRICTIONS: Yes recommended nonweightbearing in the right hand greater than 1 or 2 pounds now and likely for the next 4 weeks.  PAIN:  Are you having pain? Yes: NPRS scale: 7/10 on avg in past week  Pain location: Right index finger Pain description: Sore and aching Aggravating factors: N/A Relieving factors: Unsure  FALLS: Has patient fallen in last 6 months? No  LIVING ENVIRONMENT: Lives with: lives in a correctional facility Has following equipment at home: None  PLOF: Independent  PATIENT GOALS: To improve use of and pain in right hand index finger  NEXT MD VISIT: Approximately 2 weeks   OBJECTIVE: (All objective assessments below are from initial evaluation on: 09/22/23 unless otherwise specified.)   HAND DOMINANCE: Right   ADLs: Overall ADLs: States decreased ability to grab, hold household objects, pain and difficulty to open containers, perform FMS tasks (manipulate fasteners on clothing), mild to moderate bathing problems as well.   UPPER EXTREMITY ROM     Shoulder to Wrist AROM Right eval  Wrist flexion 70  Wrist extension 33  (Blank rows = not tested)   Hand AROM Right eval  Full Fist Ability (or Gap to Distal Palmar Crease) Unable due to stiff and sore index finger  Thumb Opposition  (Kapandji Scale)  Within functional limits  Index MCP (0-90) 0-56  Index PIP (0-100) K-wire approx 10* flexed  Index DIP (0-70)  (-16) - 31*  (Blank rows = not tested)   HAND FUNCTION: Eval: Observed weakness in affected right hand due to recent surgery, pin, safety concerns.   Expected to improve with healing, HEP, eventual grip training when allowed.   COORDINATION: Eval: Observed coordination impairments with affected right hand as seen by inability to bend the finger and is unsafe to do so aggressively now.  Details will be tested the future as needed  SENSATION: Eval:  Light touch intact today,   though somewhat diminished around sx area    EDEMA:   Eval:  Mildly swollen in right hand today, expected to improve with healing and time and immobilization  COGNITION: Eval: Overall cognitive status: WFL for evaluation today   OBSERVATIONS:   Eval: Sutures were just removed, wounds are healing well but he was advised to keep these covered for 2 to 3 days while these suture holes heal.  He was given some wound care materials for this purpose   TODAY'S TREATMENT:  Post-evaluation treatment:    Custom orthotic fabrication was indicated due to pt's healing index finger surgery and percutaneous pin in place as well as need for safe, functional positioning. OT fabricated custom right hand index finger PIP joint blocking orthosis for pt today to immobilize the PIP joint and eventually provide a stretch in extension. It fit well with no areas of pressure, pt states a comfortable fit. Pt was educated on the wearing schedule (on at all times except for hygiene and, once allowed after pin removed, exercises), to avoid exposing it to sources of heat, to wipe clean as needed (do not wash, use harsh detergents), to call or come in ASAP if it is causing any irritation or is not achieving desired function. It will be checked/adjusted in upcoming sessions, as needed. Pt states understanding all directions.    He was also given the following home exercise program to perform with his orthosis on for safety, carefully, for 5 times a day.  That should not be painful.  He was also told to do no significant weightbearing or try to bend the PIP joint yet.  After pin is removed, he can  perform gentle tendon gliding several times a day, and he should still continue to wear his orthosis to prevent PIP joint flexion contracture from reforming.  He should slowly wean out of his orthosis during the day over the course of 1 to 2 months.  At night he should wear it every night to prevent the PIP joint from contracting into flexion again.  This was given to him and typed up information as listed below.   Exercises - Bend and Pull Back Wrist SLOWLY  - 4 x daily - 10-15 reps - Tendon Glides (without PIP joint motion)- 4-6 x daily - 3-5 reps - 2-3 seconds hold - Tip Joint Blocking Motion  - 4-6 x daily - 10-15 reps  Once pin is removed and doctor allows motion at PIP joint: - Tendon Glides (including PIP joint motion)- 4-6 x daily - 3-5 reps - 2-3 seconds hold    I would recommend using a Vaseline  or bacitracin to cover the wound as needed to keep moist for the first 1-2 weeks.    Don't soak the wound for 2-3 weeks (wait until pin out to do soaking).   Compressive or non-compressive tubular gauze can be used to cover the wound.   The splint must be worn at all times until the pin is out, with the exception of washing hand or quick shower for now.   Once pin is out the splint should still be worn, except new exercises to middle joint 4-5 x day carefully.   Be careful to not "over-stretch" that joint for the first month.  The splint can be weaned over the course of a month, during the day time, but I'd recommend wearing it each night for up to 2-3 months to stretch middle joint straight.  PATIENT EDUCATION: Education details: See tx section above for details  Person educated: Patient Education method: Verbal Instruction, Teach back, Handouts  Education comprehension: States and demonstrates understanding, Additional Education required    HOME EXERCISE PROGRAM: Access Code: [email or web address] URL: [email or web address] Date: 09/23/2023 Prepared by: Melvenia Ada     GOALS: Goals reviewed with patient? Yes   SHORT TERM GOALS: (STG required if POC>30 days) Target Date: 09/23/2023  Pt will obtain protective, custom orthotic. Goal status:  MET   2.  Pt will demo/state understanding of initial HEP to improve pain levels and prerequisite motion. Goal status: Met   LONG TERM GOALS: Target Date: 11/19/2023  Pt will improve grip strength in right hand from unsafe to test to at least 25 lbs for functional use at home and in IADLs. Goal status: INITIAL  2.  Pt will improve A/ROM in right index finger total active motion from 71 degrees to at least 180 degrees, to have better functional motion for tasks like reach and grasp.  Goal status: INITIAL  3.  Pt will improve coordination skills in right hand, as seen by within functional limit score on nine-hole peg testing to have increased functional ability to carry out fine motor tasks (fasteners, etc.) and more complex, coordinated IADLs (meal prep, sports, etc.).  Goal status: INITIAL  4.  Pt will decrease pain at worst from 7/10 to 2/10 or better to have better sleep and occupational participation in daily roles. Goal status: INITIAL   ASSESSMENT:  CLINICAL IMPRESSION: Patient is a 33y.o. male who was seen today for occupational therapy evaluation for stiffness, weakness, swelling, decreased functional ability in the right dominant hand and index finger as a result of a crush injury and subsequent surgical corrections.  The patient will benefit from outpatient occupational therapy to decrease symptoms, improve functional upper extremity use, and increase quality of life.  As he is incarcerated he may not be able to return to therapy easily, so he was educated on how to progress himself over the next 4 to 6 weeks carefully, once the pin is removed.  He may not be able to return for therapy visits, though if the doctor orders it and he is able to he can be seen again to help with progressions to  full functional ability.  PERFORMANCE DEFICITS: in functional skills including ADLs, IADLs, coordination, dexterity, sensation, edema, ROM, strength, pain, fascial restrictions, Fine motor control, body mechanics, decreased knowledge of precautions, wound, and UE functional use, cognitive skills including problem solving and safety awareness, and psychosocial skills including coping strategies, environmental adaptation, habits, and routines and behaviors.   IMPAIRMENTS: are limiting patient from ADLs, IADLs, rest and  sleep, and leisure.   COMORBIDITIES: may have co-morbidities  that affects occupational performance. Patient will benefit from skilled OT to address above impairments and improve overall function.  MODIFICATION OR ASSISTANCE TO COMPLETE EVALUATION: No modification of tasks or assist necessary to complete an evaluation.  OT OCCUPATIONAL PROFILE AND HISTORY: Detailed assessment: Review of records and additional review of physical, cognitive, psychosocial history related to current functional performance.  CLINICAL DECISION MAKING: LOW - limited treatment options, no task modification necessary  REHAB POTENTIAL: Good  EVALUATION COMPLEXITY: Low      PLAN:  OT FREQUENCY: Up to 1 week, as indicated by physician when appropriate for movement at the PIP joint, strengthening, upgrades to plan of care.  OT DURATION: Potentially 8 weeks through 11/19/2023 and up to 6 total visits as needed, to help with progressions through exercise program and plan of care  PLANNED INTERVENTIONS: 97535 self care/ADL training, 02889 therapeutic exercise, 97530 therapeutic activity, 97112 neuromuscular re-education, 97140 manual therapy, 97035 ultrasound, 97032 electrical stimulation (manual), 97760 Orthotic Initial, 97763 Orthotic/Prosthetic subsequent, compression bandaging, Dry needling, energy conservation, coping strategies training, and patient/family education  RECOMMENDED OTHER SERVICES: none  now    CONSULTED AND AGREED WITH PLAN OF CARE: Patient  PLAN FOR NEXT SESSION:   Follow-up as able over the next 6 to 8 weeks to check orthosis, change in updated plan of care as physician allows, etc.  He was given information on how to progress himself over the next 4 to 6 weeks on his own if he is unable to return due to time incarcerated etc.   Melvenia Ada, OTR/L, CHT  09/23/2023, 12:08 PM

## 2023-09-23 ENCOUNTER — Encounter: Payer: Self-pay | Admitting: Rehabilitative and Restorative Service Providers"

## 2023-09-23 ENCOUNTER — Ambulatory Visit (INDEPENDENT_AMBULATORY_CARE_PROVIDER_SITE_OTHER): Admitting: Rehabilitative and Restorative Service Providers"

## 2023-09-23 ENCOUNTER — Other Ambulatory Visit (INDEPENDENT_AMBULATORY_CARE_PROVIDER_SITE_OTHER): Payer: Self-pay

## 2023-09-23 ENCOUNTER — Ambulatory Visit (INDEPENDENT_AMBULATORY_CARE_PROVIDER_SITE_OTHER): Admitting: Orthopedic Surgery

## 2023-09-23 DIAGNOSIS — M25641 Stiffness of right hand, not elsewhere classified: Secondary | ICD-10-CM

## 2023-09-23 DIAGNOSIS — S6991XA Unspecified injury of right wrist, hand and finger(s), initial encounter: Secondary | ICD-10-CM | POA: Diagnosis not present

## 2023-09-23 DIAGNOSIS — M79641 Pain in right hand: Secondary | ICD-10-CM | POA: Diagnosis not present

## 2023-09-23 DIAGNOSIS — M6281 Muscle weakness (generalized): Secondary | ICD-10-CM

## 2023-09-23 DIAGNOSIS — R600 Localized edema: Secondary | ICD-10-CM | POA: Diagnosis not present

## 2023-09-23 NOTE — Progress Notes (Signed)
   Joseph Franco - 33 y.o. male MRN [id number]  Date of birth: 1990/08/04  Office Visit Note: Visit Date: 09/23/2023 PCP: Patient, No Pcp Per Referred by: No ref. provider found  Subjective:  HPI: Joseph Franco is a 33 y.o. male who presents today for follow up 2 weeks status post right index finger central slip repair with PIP joint stabilization.  He is doing well overall, pain is controlled, pin site remains clean dry and intact.  Pertinent ROS were reviewed with the patient and found to be negative unless otherwise specified above in HPI.   Assessment & Plan: Visit Diagnoses:  1. Injury of right hand, initial encounter     Plan: He is doing well postoperatively.  Occupational Therapy will see him today for fabrication of an orthosis.  Will focus on range of motion at the DIP and the MP, PIP remains stable.  Pin will remain in place for additional 2 weeks.  Follow-up: No follow-ups on file.   Meds & Orders: No orders of the defined types were placed in this encounter.   Orders Placed This Encounter  Procedures   XR Finger Index Right     Procedures: No procedures performed       Objective:   Vital Signs: There were no vitals taken for this visit.  Ortho Exam Right index finger: - Pin in place stabilizing the PIP joint in extended posture, pin remains clean dry and intact, no evidence of erythema or drainage - DIP joint remains supple, able to perform active extension and flexion at the DIP - MP joint with appropriate range of motion - No significant swelling, sensation intact distally, normal color and capillary refill to the digits  Imaging: XR Finger Index Right Result Date: 09/23/2023 X-rays of the right index finger demonstrate stable appearance of the PIP joint in extended posture, pin remains well-fixed in all planes    Joseph Franco Joseph Franco, M.D. [facility], Hand Surgery

## 2023-10-13 ENCOUNTER — Other Ambulatory Visit (INDEPENDENT_AMBULATORY_CARE_PROVIDER_SITE_OTHER): Payer: Self-pay

## 2023-10-13 ENCOUNTER — Ambulatory Visit (INDEPENDENT_AMBULATORY_CARE_PROVIDER_SITE_OTHER): Admitting: Orthopedic Surgery

## 2023-10-13 DIAGNOSIS — S6991XA Unspecified injury of right wrist, hand and finger(s), initial encounter: Secondary | ICD-10-CM

## 2023-10-13 MED ORDER — BACITRACIN 500 UNIT/GM EX OINT: 1.0000 | TOPICAL_OINTMENT | Freq: Two times a day (BID) | CUTANEOUS | 0 refills | Status: AC

## 2023-10-13 NOTE — Progress Notes (Signed)
   Joseph Franco - 33 y.o. male MRN [id number]  Date of birth: December 21, 1990  Office Visit Note: Visit Date: 10/13/2023 PCP: Patient, No Pcp Per Referred by: No ref. provider found  Subjective:  HPI: Joseph Franco is a 33 y.o. male who presents today for follow up 5 weeks status post Right index finger central slip repair with PIP joint stabilization.  Doing well overall, pain is controlled.  Wearing splint fabricated by OT.  Pertinent ROS were reviewed with the patient and found to be negative unless otherwise specified above in HPI.   Assessment & Plan: Visit Diagnoses:  1. Injury of right hand, initial encounter     Plan: Pin was removed today without incident.  PIP remains stable.  Continue with splint for additional 3 weeks.  At that point, we will arrange reestablish with OT to begin motion exercises.  Follow-up: No follow-ups on file.   Meds & Orders:  Meds ordered this encounter  Medications   bacitracin  500 UNIT/GM ointment    Sig: Apply 1 Application topically 2 (two) times daily.    Dispense:  15 g    Refill:  0    Orders Placed This Encounter  Procedures   XR Finger Index Right     Procedures: No procedures performed       Objective:   Vital Signs: There were no vitals taken for this visit.  Ortho Exam Right index finger pin removed today without incident, pin site clean dry and intact, PIP remains stable, extended position, flexion is limited secondary to ongoing stiffness, DIP remains supple, normal color and capillary refill throughout the digits  Imaging: XR Finger Index Right Result Date: 10/13/2023 Stable appearance of the pin across the index finger PIP joint, PIP remains well located in all planes.    [name], M.D. [facility], Hand Surgery

## 2023-10-14 ENCOUNTER — Telehealth: Payer: Self-pay

## 2023-10-14 NOTE — Telephone Encounter (Signed)
 [facility] called concerned of possible infection due to the pin being removed yesterday. The nurse stated that his finger was very swollen, red and hot to the touch. We were able to okay for administration of Keflex  250 mg tid for 7 days. He also now has an appointment for 10/19/23

## 2023-10-14 NOTE — Telephone Encounter (Signed)
 I called and talked to [facility] medical team at the jail @ [phone]. Pt is out of splint due to increased swelling. I advised to place back in a splint similar to the old one to keep him from moving it. She said she would. We hold off on proceeding with OT until he is seen next week. Spoke to Dr erwin who agreed with this plan. Last office note was faxed to them @ [phone]

## 2023-10-19 ENCOUNTER — Encounter: Admitting: Orthopedic Surgery

## 2023-10-19 NOTE — Therapy (Signed)
 OUTPATIENT OCCUPATIONAL THERAPY TREATMENT NOTE   Patient Name: Joseph Franco MRN: [id number] DOB:07-Jan-1991, 33 y.o., male Today's Date: 10/21/2023  PCP: N/A REFERRING PROVIDER: Arlinda Buster, MD   END OF SESSION:  OT End of Session - 10/21/23 1150     Visit Number 2    Number of Visits 6    Date for OT Re-Evaluation 11/19/23    Authorization Type state correctional facility    OT Start Time 1150    OT Stop Time 1233    OT Time Calculation (min) 43 min    Equipment Utilized During Treatment Orthotic materials    Activity Tolerance Patient tolerated treatment well;No increased pain;Patient limited by pain;Patient limited by fatigue    Behavior During Therapy [facility] for tasks assessed/performed           Past Medical History:  Diagnosis Date   Anxiety    Crush injury to finger 08/09/2023   Depression    Past Surgical History:  Procedure Laterality Date   CAPSULOTOMY Right 09/10/2023   Procedure: CAPSULOTOMY, WRIST;  Surgeon: Arlinda Buster, MD;  Location: [facility];  Service: Orthopedics;  Laterality: Right;  RIGHT INDEX FINGER PIP JOINT STABILIZATION WITH CAPSULEDESIS AND PINNING   HAND SURGERY Right    MOUTH SURGERY     Patient Active Problem List   Diagnosis Date Noted   Central slip extensor tendon injury (boutonniere), right 09/10/2023    ONSET DATE: DOS 09/10/23  REFERRING DIAG: D30.08KJ (ICD-10-CM) - Injury of right hand, initial encounter   THERAPY DIAG:  Localized edema  Muscle weakness (generalized)  Pain in right hand  Stiffness of right hand, not elsewhere classified  Rationale for Evaluation and Treatment: Rehabilitation  PERTINENT HISTORY: about 4 months s/p crush injury with partial amputations of IF, RF of Rt hand. He developed flexion contracture of PIPJ and underwent lateral band repair and PIP J capsulodesis with pinning    He states having high pain in his finger, at the moment.  He is encouraged that over the next 2  weeks, especially in a brace, that his pain should come down as he heals.  PRECAUTIONS: None  RED FLAGS: None   WEIGHT BEARING RESTRICTIONS: Yes recommended nonweightbearing in the right hand greater than 1 or 2 pounds now and likely for the next 4 weeks.    SUBJECTIVE:   SUBJECTIVE STATEMENT: Now  ~6 weeks post-op.  He states that his hands swollen up very largely after the pin was removed, he then could not do his exercises or wear his other orthosis because it no longer fit well.  He has higher pain now and increased flexion deformity of the PIP joint of the index finger.    Pt accompanied by: guard- the pt is currently incarcerated    PAIN:  Are you having pain? Yes: NPRS scale:  8/10 on avg in past week  Pain location: Right index finger Pain description: Sore and aching Aggravating factors: N/A Relieving factors: Unsure   LIVING ENVIRONMENT: Lives with: lives in a correctional facility Has following equipment at home: None   PATIENT GOALS: To improve use of and pain in right hand index finger  NEXT MD VISIT: Approximately 2 weeks   OBJECTIVE: (All objective assessments below are from initial evaluation on: 09/22/23 unless otherwise specified.)   HAND DOMINANCE: Right   ADLs: Overall ADLs: States decreased ability to grab, hold household objects, pain and difficulty to open containers, perform FMS tasks (manipulate fasteners on clothing), mild to moderate bathing  problems as well.   UPPER EXTREMITY ROM     Shoulder to Wrist AROM Right eval  Wrist flexion 70  Wrist extension 33  (Blank rows = not tested)   Hand AROM Right eval Rt 10/21/23  Full Fist Ability (or Gap to Distal Palmar Crease) Unable due to stiff and sore index finger   Thumb Opposition  (Kapandji Scale)  Within functional limits   Index MCP (0-90) 0-56 0 - 88  Index PIP (0-100) K-wire approx 10* flexed (-51) - 82  Index DIP (0-70)  (-16) - 31* 0 - 16  (Blank rows = not tested)   HAND  FUNCTION: Eval: Observed weakness in affected right hand due to recent surgery, pin, safety concerns.  Expected to improve with healing, HEP, eventual grip training when allowed.   COORDINATION: Eval: Observed coordination impairments with affected right hand as seen by inability to bend the finger and is unsafe to do so aggressively now.  Details will be tested the future as needed  SENSATION: Eval:  Light touch intact today,   though somewhat diminished around sx area    EDEMA:   Eval:  Mildly swollen in right hand today, expected to improve with healing and time and immobilization  OBSERVATIONS:   Eval: Sutures were just removed, wounds are healing well but he was advised to keep these covered for 2 to 3 days while these suture holes heal.  He was given some wound care materials for this purpose   TODAY'S TREATMENT:  10/21/23: Per doctor's orders and due to increased swelling and problems at the PIP joint of the right index finger, OT makes a new custom orthosis today.  OT also gives education on home exercises to perform especially reverse blocking motion to help correct PIP flexion contracture.  We discussed safety, no weightbearing, trying to gently make a fist but focusing most of his efforts on extension at the PIP joint without creating DIP joint hyperextension.  We also discussed weaning from this new large orthosis to a smaller hand-based orthosis that he already has in 3 to 4 weeks if he can maintain good PIP joint extension outside of the new orthosis.  He states understanding and leaves without any questions or significant increases in pain.  Custom orthotic fabrication was indicated due to pt's newly swollen finger and contracted PIP joint of the right index finger and need for safe, functional positioning. OT fabricated custom hand-based index finger extension SAFE orthosis for pt today to stretch the PIP joint into full extension engaging the natural tenodesis motion. It fit well  with no areas of pressure, pt states a comfortable fit. Pt was educated on the wearing schedule (on at all times except for hygiene and exercises), to avoid exposing it to sources of heat, to wipe clean as needed (do not wash, use harsh detergents), to call or come in ASAP if it is causing any irritation or is not achieving desired function. It will be checked/adjusted in upcoming sessions, as needed. Pt states understanding all directions.            Post-evaluation treatment:    Custom orthotic fabrication was indicated due to pt's healing index finger surgery and percutaneous pin in place as well as need for safe, functional positioning. OT fabricated custom right hand index finger PIP joint blocking orthosis for pt today to immobilize the PIP joint and eventually provide a stretch in extension. It fit well with no areas of pressure, pt states a comfortable fit. Pt  was educated on the wearing schedule (on at all times except for hygiene and, once allowed after pin removed, exercises), to avoid exposing it to sources of heat, to wipe clean as needed (do not wash, use harsh detergents), to call or come in ASAP if it is causing any irritation or is not achieving desired function. It will be checked/adjusted in upcoming sessions, as needed. Pt states understanding all directions.    He was also given the following home exercise program to perform with his orthosis on for safety, carefully, for 5 times a day.  That should not be painful.  He was also told to do no significant weightbearing or try to bend the PIP joint yet.  After pin is removed, he can perform gentle tendon gliding several times a day, and he should still continue to wear his orthosis to prevent PIP joint flexion contracture from reforming.  He should slowly wean out of his orthosis during the day over the course of 1 to 2 months.  At night he should wear it every night to prevent the PIP joint from contracting into flexion again.  This  was given to him and typed up information as listed below.    Exercises - Bend and Pull Back Wrist SLOWLY  - 4-6 x daily - 10-15 reps - Tendon Glides  - 4-6 x daily - 3-5 reps - 2-3 seconds hold - Hand AROM Reverse Blocking  - 4-6 x daily - 10-15 reps - 3 sec hold     PATIENT EDUCATION: Education details: See tx section above for details  Person educated: Patient Education method: Engineer, structural, Teach back, Handouts  Education comprehension: States and demonstrates understanding, Additional Education required    HOME EXERCISE PROGRAM: Access Code: [email or web address] URL: [email or web address] Date: 09/23/2023 Prepared by: Melvenia Ada     GOALS: Goals reviewed with patient? Yes   SHORT TERM GOALS: (STG required if POC>30 days) Target Date: 09/23/2023  Pt will obtain protective, custom orthotic. Goal status:  MET   2.  Pt will demo/state understanding of initial HEP to improve pain levels and prerequisite motion. Goal status: Met   LONG TERM GOALS: Target Date: 11/19/2023  Pt will improve grip strength in right hand from unsafe to test to at least 25 lbs for functional use at home and in IADLs. Goal status: INITIAL  2.  Pt will improve A/ROM in right index finger total active motion from 71 degrees to at least 180 degrees, to have better functional motion for tasks like reach and grasp.  Goal status: INITIAL  3.  Pt will improve coordination skills in right hand, as seen by within functional limit score on nine-hole peg testing to have increased functional ability to carry out fine motor tasks (fasteners, etc.) and more complex, coordinated IADLs (meal prep, sports, etc.).  Goal status: INITIAL  4.  Pt will decrease pain at worst from 7/10 to 2/10 or better to have better sleep and occupational participation in daily roles. Goal status: INITIAL   ASSESSMENT:  CLINICAL IMPRESSION: 10/21/23: He now has a well-fitting orthosis that will help stretch the  PIP joint into full extension.  He has home exercise program and a plan to include how to wean out of the orthosis, perhaps return to the smaller orthosis, all while trying to maintain his ability to make a fist improve his ability to extend at the PIP joint.     PLAN:  OT FREQUENCY: Up to 1 week, as indicated by physician when appropriate  for movement at the PIP joint, strengthening, upgrades to plan of care.  OT DURATION: Potentially 8 weeks through 11/19/2023 and up to 6 total visits as needed, to help with progressions through exercise program and plan of care  PLANNED INTERVENTIONS: 97535 self care/ADL training, 02889 therapeutic exercise, 97530 therapeutic activity, 97112 neuromuscular re-education, 97140 manual therapy, 97035 ultrasound, 97032 electrical stimulation (manual), 97760 Orthotic Initial, [id number] Orthotic/Prosthetic subsequent, compression bandaging, Dry needling, energy conservation, coping strategies training, and patient/family education  CONSULTED AND AGREED WITH PLAN OF CARE: Patient  PLAN FOR NEXT SESSION:   Follow-up as able over the next 4 weeks to check orthosis, update plan of care as needed.  He was given information on how to progress himself over the next 4 to 6 weeks on his own if he is unable to return due to time incarcerated etc.  Melvenia Ada, OTR/L, CHT  10/21/2023, 12:58 PM

## 2023-10-21 ENCOUNTER — Encounter: Payer: Self-pay | Admitting: Rehabilitative and Restorative Service Providers"

## 2023-10-21 ENCOUNTER — Ambulatory Visit (INDEPENDENT_AMBULATORY_CARE_PROVIDER_SITE_OTHER): Admitting: Orthopedic Surgery

## 2023-10-21 ENCOUNTER — Ambulatory Visit (INDEPENDENT_AMBULATORY_CARE_PROVIDER_SITE_OTHER): Admitting: Rehabilitative and Restorative Service Providers"

## 2023-10-21 DIAGNOSIS — M79641 Pain in right hand: Secondary | ICD-10-CM

## 2023-10-21 DIAGNOSIS — R600 Localized edema: Secondary | ICD-10-CM

## 2023-10-21 DIAGNOSIS — M25641 Stiffness of right hand, not elsewhere classified: Secondary | ICD-10-CM | POA: Diagnosis not present

## 2023-10-21 DIAGNOSIS — S6991XA Unspecified injury of right wrist, hand and finger(s), initial encounter: Secondary | ICD-10-CM

## 2023-10-21 DIAGNOSIS — M6281 Muscle weakness (generalized): Secondary | ICD-10-CM | POA: Diagnosis not present

## 2023-10-21 NOTE — Progress Notes (Signed)
   Joseph Franco - 33 y.o. male MRN [id number]  Date of birth: [birth year]  Office Visit Note: Visit Date: 10/21/2023 PCP: Patient, No Pcp Per Referred by: No ref. provider found  Subjective:  HPI: Joseph Franco is a 33 y.o. male who presents today for follow up 6 weeks status post Right index finger central slip repair with PIP joint stabilization .  Having some ongoing swelling at the PIP region with associated erythema.  Pertinent ROS were reviewed with the patient and found to be negative unless otherwise specified above in HPI.   Assessment & Plan: Visit Diagnoses:  1. Injury of right hand, initial encounter     Plan: He has developed some cellulitis at the index finger PIP region.  Pin site is well-healed currently.  His extension remains limited at the PIP region.  I have asked that he be seen by occupational therapy today for fabrication of a more stout orthosis in order to hold our PIP extension more securely to allow for further healing.  I did explain that given the chronicity of this problem, there will likely be some extensor lag that persists.  He will be seen by occupational therapy later today, we will likely see him back in approximately 4 to 6 weeks to track his progress from a healing standpoint.  Follow-up: No follow-ups on file.   Meds & Orders: No orders of the defined types were placed in this encounter.  No orders of the defined types were placed in this encounter.    Procedures: No procedures performed       Objective:   Vital Signs: There were no vitals taken for this visit.  Ortho Exam PIP remains stable, notable erythema over the dorsal aspect of the digit with associated tenderness, flexed position, extension is limited, DIP remains supple, normal color and capillary refill throughout the digits   Imaging: No results found.   Ramah Langhans Afton Alderton, M.D. [facility], Hand Surgery

## 2024-01-24 ENCOUNTER — Encounter: Admitting: Orthopedic Surgery

## 2024-01-26 NOTE — Progress Notes (Signed)
° °  Joseph Franco - 33 y.o. male MRN [id number]  Date of birth: 04-Oct-1990  Office Visit Note: Visit Date: 01/27/2024 PCP: Patient, No Pcp Per Referred by: No ref. provider found  Subjective:  HPI: Joseph Franco is a 33 y.o. male who presents today for follow up 20 weeks status post right index finger central slip repair with PIP joint stabilization.  Pain controlled, swelling improved, does have persistent flexion at the PIP, however passive range of motion is improving as is active extension.  Pertinent ROS were reviewed with the patient and found to be negative unless otherwise specified above in HPI.   Assessment & Plan: Visit Diagnoses:  1. Injury of right hand, initial encounter     Plan: New oval 8 splint was given today for the index finger PIP joint to be utilized as needed.  He can return as needed moving forward.  Follow-up: No follow-ups on file.   Meds & Orders: No orders of the defined types were placed in this encounter.   Orders Placed This Encounter  Procedures   XR Finger Index Right     Procedures: No procedures performed       Objective:   Vital Signs: There were no vitals taken for this visit.  Ortho Exam Right index finger with well-healed incisional site, flexion deformity is present at the PIP, able to perform active extension to approximately 30 degrees flexed posture, passive extension near full without pain, composite fist without restriction  Imaging: No results found. X-rays of the right index finger obtained  Tamerra Merkley Estela) Taequan Stockhausen, M.D. [facility], Hand Surgery

## 2024-01-27 ENCOUNTER — Ambulatory Visit: Admitting: Orthopedic Surgery

## 2024-01-27 ENCOUNTER — Other Ambulatory Visit: Payer: Self-pay

## 2024-01-27 DIAGNOSIS — S6991XA Unspecified injury of right wrist, hand and finger(s), initial encounter: Secondary | ICD-10-CM | POA: Diagnosis not present
